# Patient Record
Sex: Female | Born: 1985 | Race: Black or African American | Hispanic: No | Marital: Single | Smoking: Never smoker
Health system: Southern US, Community
[De-identification: ages and names within clinical notes are randomized; demographics above are authoritative.]

---

## 2016-09-07 ENCOUNTER — Ambulatory Visit (INDEPENDENT_AMBULATORY_CARE_PROVIDER_SITE_OTHER): Payer: Managed Care, Other (non HMO)

## 2016-09-07 ENCOUNTER — Ambulatory Visit (INDEPENDENT_AMBULATORY_CARE_PROVIDER_SITE_OTHER): Payer: Managed Care, Other (non HMO) | Admitting: Physician Assistant

## 2016-09-07 VITALS — BP 110/76 | HR 102 | Temp 98.8°F | Resp 17 | Ht 64.0 in | Wt 164.0 lb

## 2016-09-07 DIAGNOSIS — M25572 Pain in left ankle and joints of left foot: Secondary | ICD-10-CM

## 2016-09-07 DIAGNOSIS — S93492A Sprain of other ligament of left ankle, initial encounter: Secondary | ICD-10-CM

## 2016-09-07 NOTE — Patient Instructions (Addendum)
  Take Ibuprofen 600 mg every 8 hours as needed for pain for about 7 days.  After then move to Tylenol 1000 mg every eight hours for pain.     IF you received an x-ray today, you will receive an invoice from [facility]. Please contact [facility] at [phone] with questions or concerns regarding your invoice.   IF you received labwork today, you will receive an invoice from [facility]. Please contact [facility] at [phone] with questions or concerns regarding your invoice.   Our billing staff will not be able to assist you with questions regarding bills from these companies.  You will be contacted with the lab results as soon as they are available. The fastest way to get your results is to activate your My Chart account. Instructions are located on the last page of this paperwork. If you have not heard from us regarding the results in 2 weeks, please contact this office.

## 2016-09-07 NOTE — Progress Notes (Signed)
   09/07/2016 4:35 PM   DOB: 01-25-1986 / MRN: [id number]  SUBJECTIVE:  Becky Gardner is a 30 y.o. female presenting for left foot pain that started after falling down some stair this morning.  She was able to walk after the injury. She associates swelling of the ankle as well as a throbbing and excruciating pain. Ambulation makes the pain worse.  She has tried NSAIDs with some relief.    She is allergic to penicillins.   She  has no past medical history on file.    She  reports that she has never smoked. She does not have any smokeless tobacco history on file. She  has no sexual activity history on file. The patient  has no past surgical history on file.  Her family history is not on file.  Review of Systems  Constitutional: Negative for fever.  Gastrointestinal: Negative for nausea.  Musculoskeletal: Positive for joint pain. Negative for myalgias.  Skin: Negative for rash.    The problem list and medications were reviewed and updated by myself where necessary and exist elsewhere in the encounter.   OBJECTIVE:  BP 110/76 (BP Location: Right Arm, Patient Position: Sitting, Cuff Size: Normal)   Pulse (!) 102   Temp 98.8 F (37.1 C) (Oral)   Resp 17   Ht 5\' 4"  (1.626 m)   Wt 164 lb (74.4 kg)   LMP 08/12/2016 (Approximate)   SpO2 100%   BMI 28.15 kg/m   Physical Exam  Constitutional: She is oriented to person, place, and time.  Cardiovascular: Normal rate, normal heart sounds and intact distal pulses.   Pulses:      Dorsalis pedis pulses are 2+ on the left side.       Posterior tibial pulses are 2+ on the left side.  Pulmonary/Chest: Effort normal and breath sounds normal.  Musculoskeletal: She exhibits edema (Left lateral maleolus tenderness and tenderness about the distal insertion of the ATFL.  Negative open drawer.  Mild swelling without bruising. negative for tenderness at the 5th  metarsal, fibular head, medial maleolus. ).  Neurological: She is alert and oriented to  person, place, and time.    No results found for this or any previous visit (from the past 72 hour(s)).  Dg Ankle Complete Left  Result Date: 09/07/2016 CLINICAL DATA:  Left ankle pain and swelling, inversion injury today during a fall EXAM: LEFT ANKLE COMPLETE - 3+ VIEW COMPARISON:  None. FINDINGS: The ankle joint appears normal. Alignment is normal. No acute fracture is seen. IMPRESSION: Negative. Electronically Signed   By: Dwyane DeePaul  Barry M.D.   On: 09/07/2016 16:25    ASSESSMENT AND PLAN  Becky Gardner was seen today for ankle injury.  Diagnoses and all orders for this visit:  Acute left ankle pain -     DG Ankle Complete Left; Future  Sprain of anterior talofibular ligament of left ankle, initial encounter: Sweedo and NSAIDs for AVS.     The patient is advised to call or return to clinic if she does not see an improvement in symptoms, or to seek the care of the closest emergency department if she worsens with the above plan.   Becky Gardner, MHS, PA-C [facility] 09/07/2016 4:35 PM

## 2017-12-17 IMAGING — DX DG ANKLE COMPLETE 3+V*L*
3 series · 3 of 3 positions shown · non-contrast
Comparison: None.

CLINICAL DATA: Left ankle pain and swelling, inversion injury today
during a fall

EXAM:
LEFT ANKLE COMPLETE - 3+ VIEW

[ankle ap]
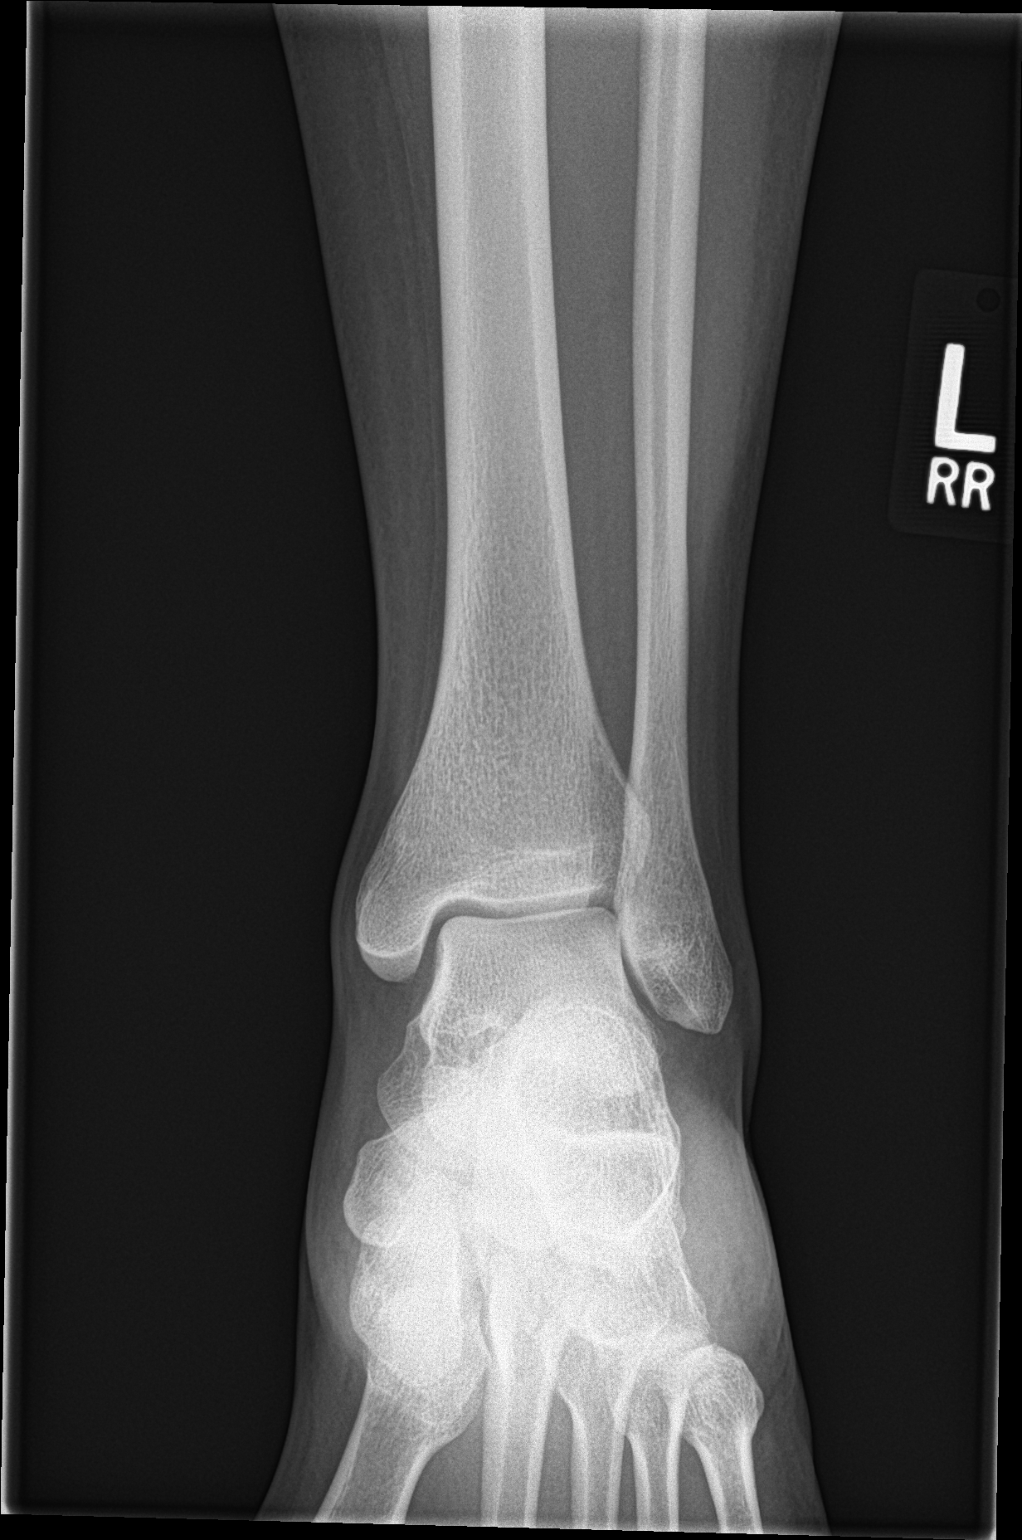

[ankle obl]
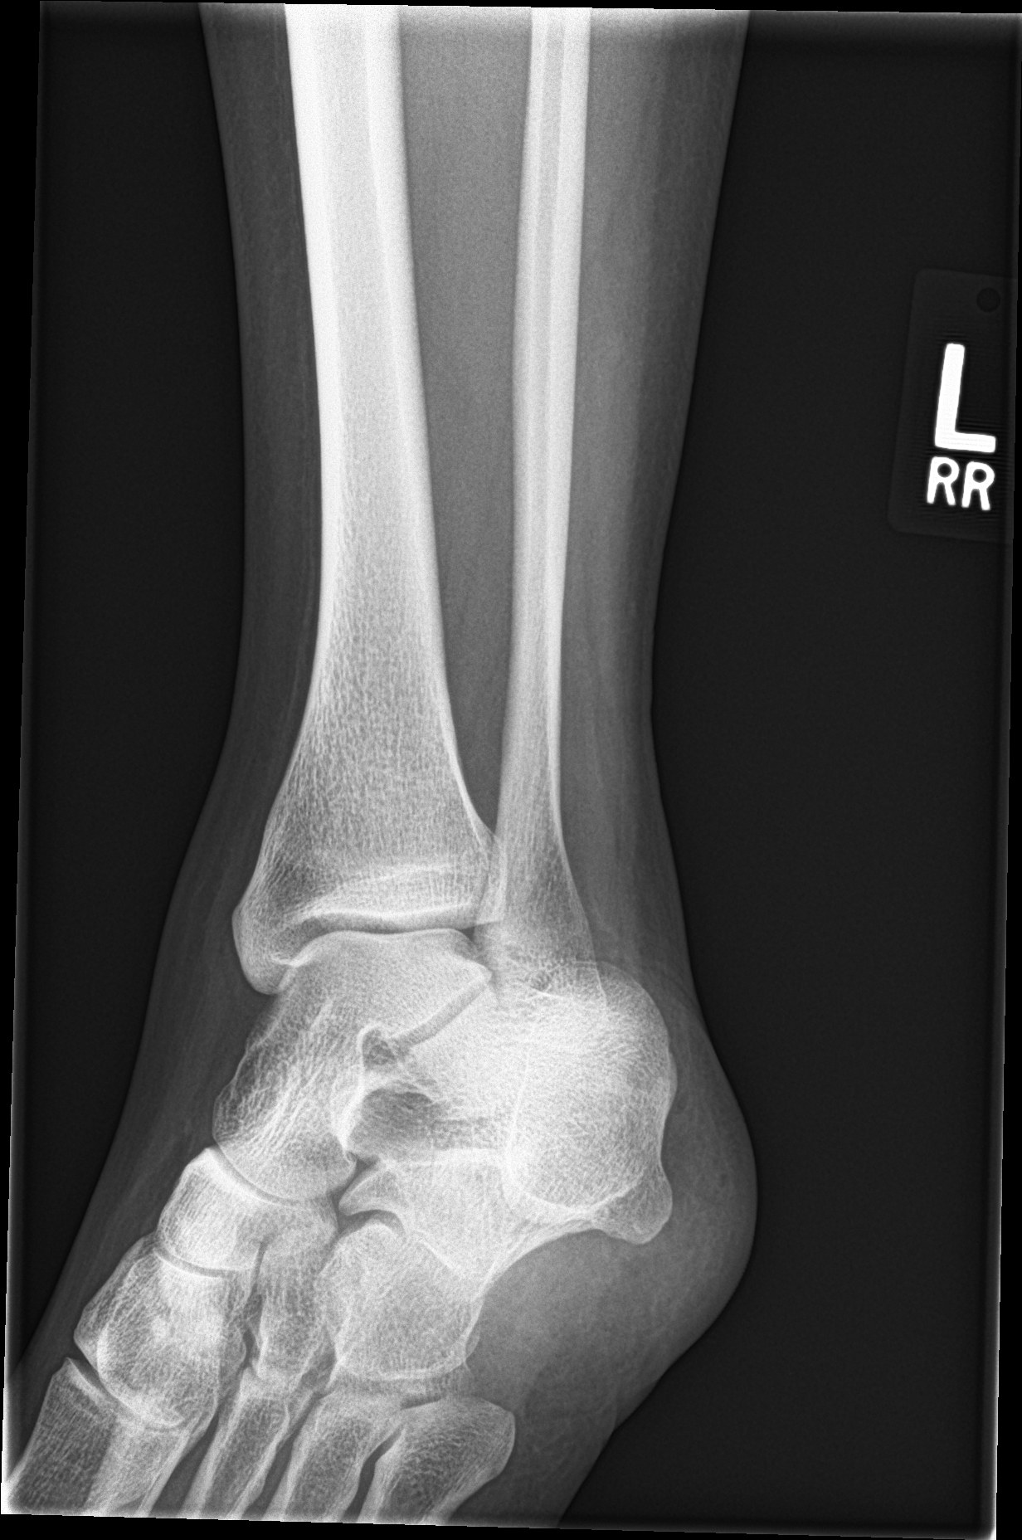

[ankle lat]
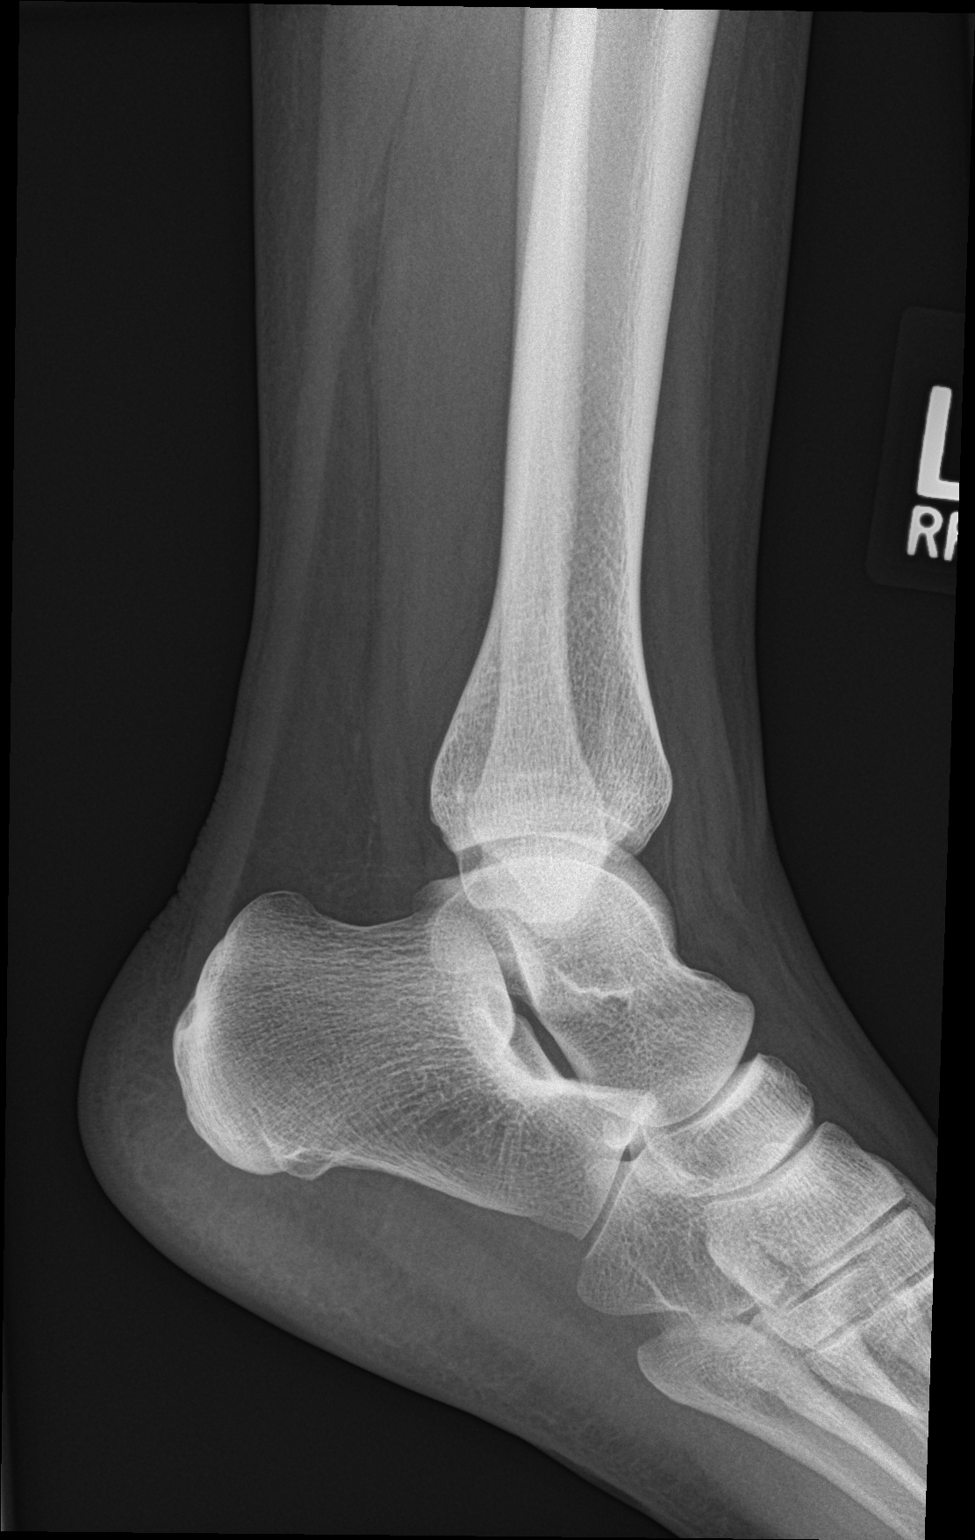

[3 of 3 positions shown; findings below may reference images not displayed]

FINDINGS: The ankle joint appears normal. Alignment is normal. No acute
fracture is seen.
IMPRESSION: Negative.
